# Patient Record
Sex: Male | Born: 2000 | Race: White | Hispanic: No | Marital: Single | Smoking: Never smoker
Health system: Southern US, Community
[De-identification: ages and names within clinical notes are randomized; demographics above are authoritative.]

## PROBLEM LIST (undated history)

## (undated) HISTORY — PX: NO PAST SURGERIES: SHX2092

---
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
## 2019-01-26 ENCOUNTER — Other Ambulatory Visit: Payer: Self-pay | Admitting: Physician Assistant

## 2019-01-26 DIAGNOSIS — R599 Enlarged lymph nodes, unspecified: Secondary | ICD-10-CM

## 2019-02-04 ENCOUNTER — Other Ambulatory Visit: Payer: Self-pay

## 2019-02-04 ENCOUNTER — Ambulatory Visit
Admission: RE | Admit: 2019-02-04 | Discharge: 2019-02-04 | Disposition: A | Discharge status: Home / Self Care | Payer: BC Managed Care – PPO | Source: Ambulatory Visit | Attending: Physician Assistant | Admitting: Physician Assistant

## 2019-02-04 DIAGNOSIS — R599 Enlarged lymph nodes, unspecified: Secondary | ICD-10-CM | POA: Insufficient documentation

## 2019-06-22 ENCOUNTER — Other Ambulatory Visit: Payer: Self-pay

## 2019-06-22 DIAGNOSIS — Z20822 Contact with and (suspected) exposure to covid-19: Secondary | ICD-10-CM

## 2019-06-23 ENCOUNTER — Telehealth: Payer: Self-pay | Admitting: General Practice

## 2019-06-23 LAB — NOVEL CORONAVIRUS, NAA: SARS-CoV-2, NAA: NOT DETECTED

## 2019-06-23 NOTE — Telephone Encounter (Signed)
Negative COVID results given. Patient results "NOT Detected." Caller expressed understanding. ° °

## 2020-04-11 ENCOUNTER — Telehealth (INDEPENDENT_AMBULATORY_CARE_PROVIDER_SITE_OTHER): Payer: BC Managed Care – PPO | Admitting: Psychiatry

## 2020-04-11 ENCOUNTER — Other Ambulatory Visit: Payer: Self-pay

## 2020-04-11 DIAGNOSIS — F988 Other specified behavioral and emotional disorders with onset usually occurring in childhood and adolescence: Secondary | ICD-10-CM

## 2020-04-11 NOTE — Progress Notes (Signed)
Patient is currently not physically present in [place].  Hence discussed with him that this evaluation cannot be completed.  He will call to reschedule as needed.

## 2020-12-11 IMAGING — US SOFT TISSUE ULTRASOUND HEAD/NECK
1 series · 14 of 25 positions shown · non-contrast
Comparison: None.

CLINICAL DATA: Enlarged lymph node

EXAM:
ULTRASOUND OF HEAD/NECK SOFT TISSUES
TECHNIQUE: Ultrasound examination of the head and neck soft tissues was
performed in the area of clinical concern.

[Series 1: soft tissue ultrasound head/neck · 0.07mm/px · 14 of 29 slices shown]
[im 1/29]
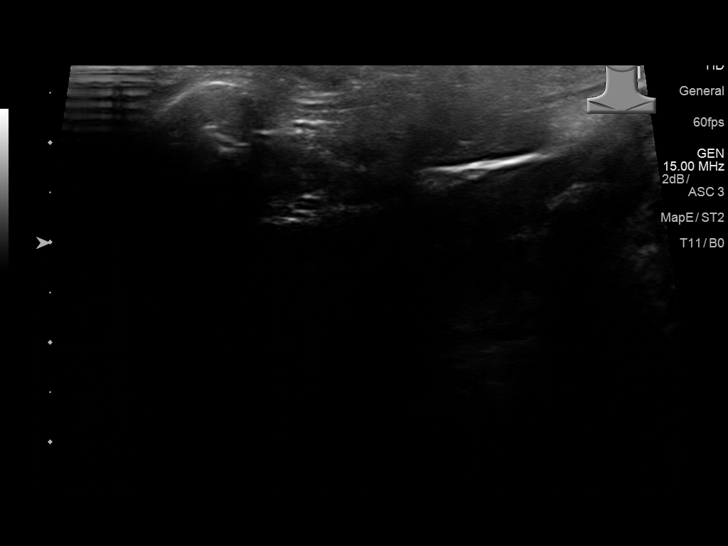
[im 3/29]
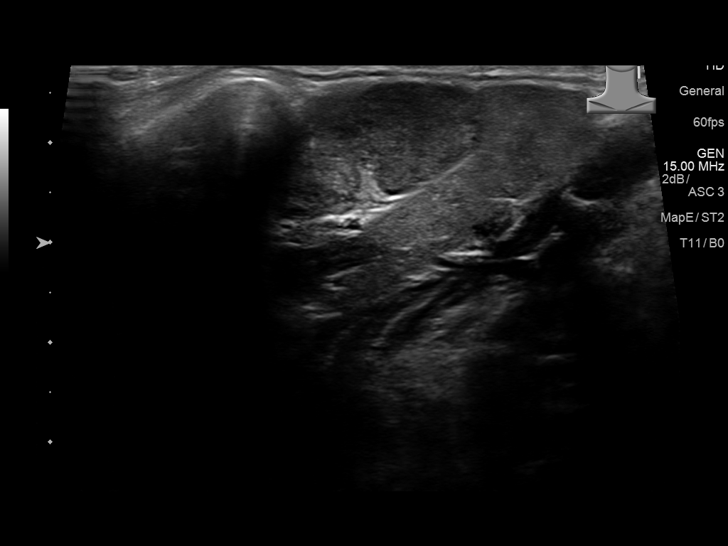
[im 5/29]
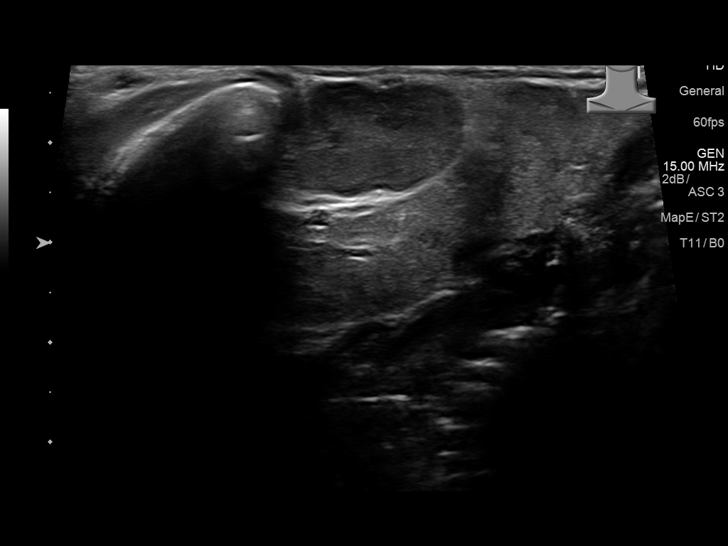
[im 8/29]
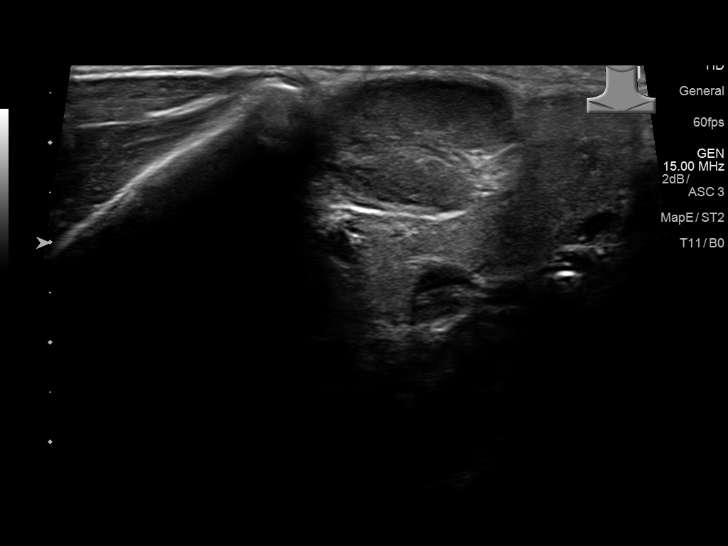
[im 10/29]
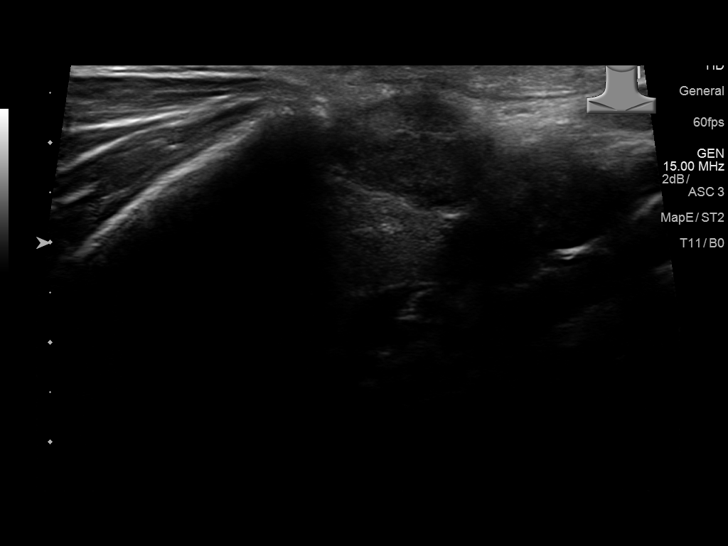
[im 11/29]
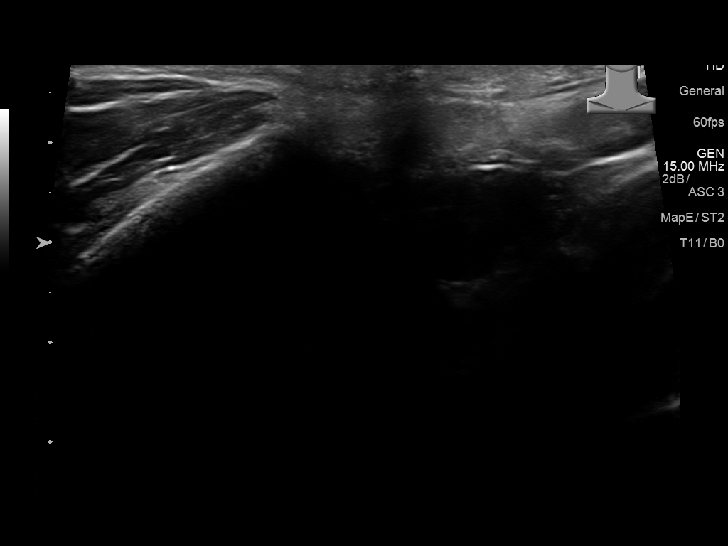
[im 13/29]
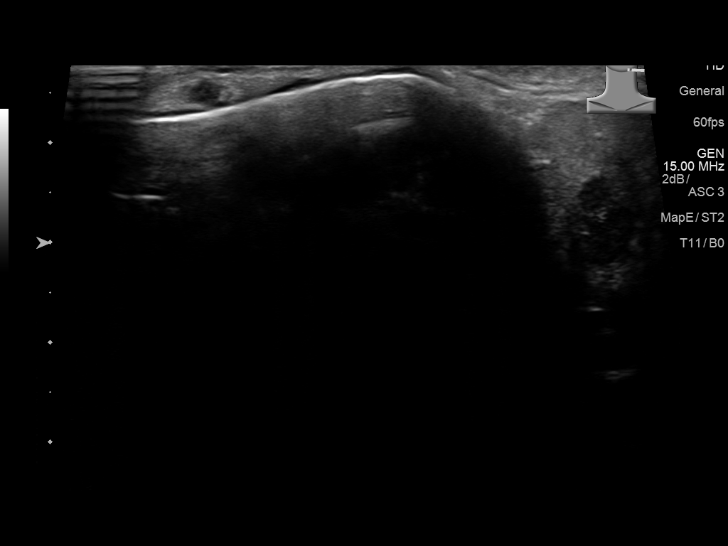
[im 16/29]
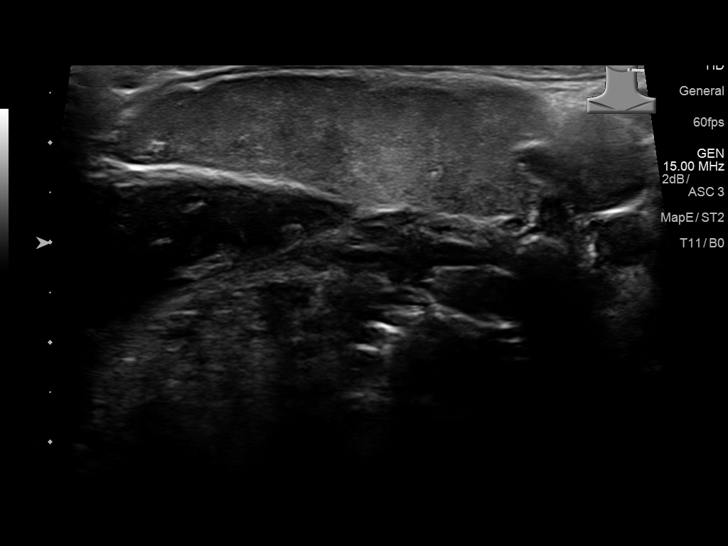
[im 18/29]
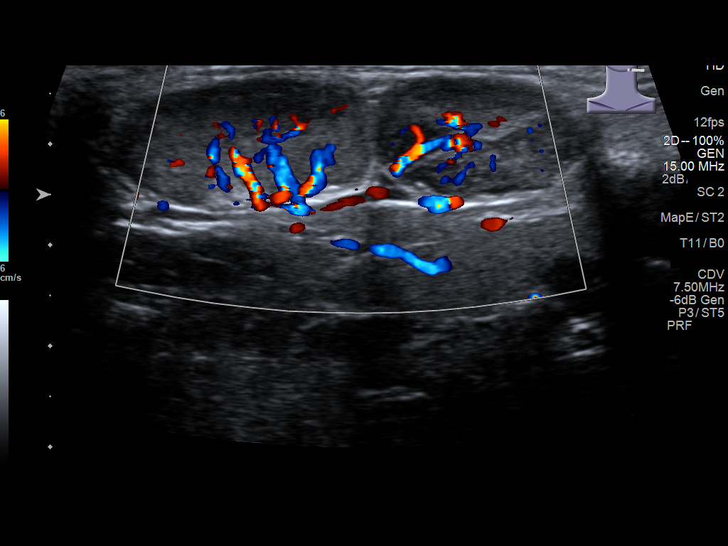
[im 19/29]
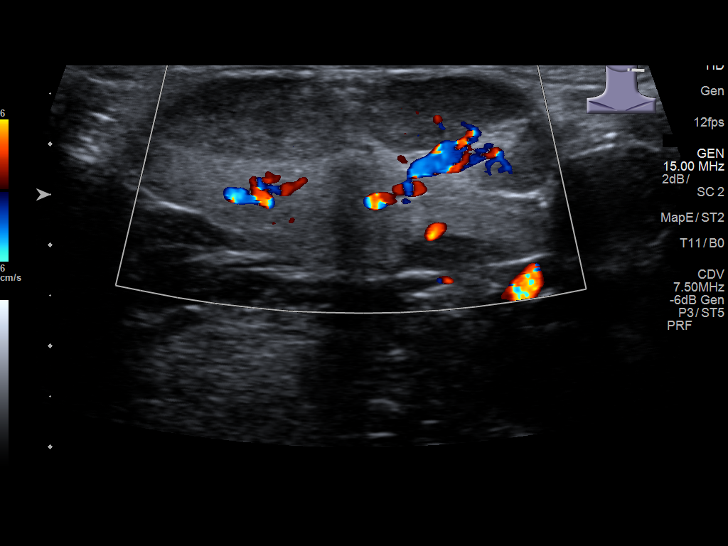
[im 22/29]
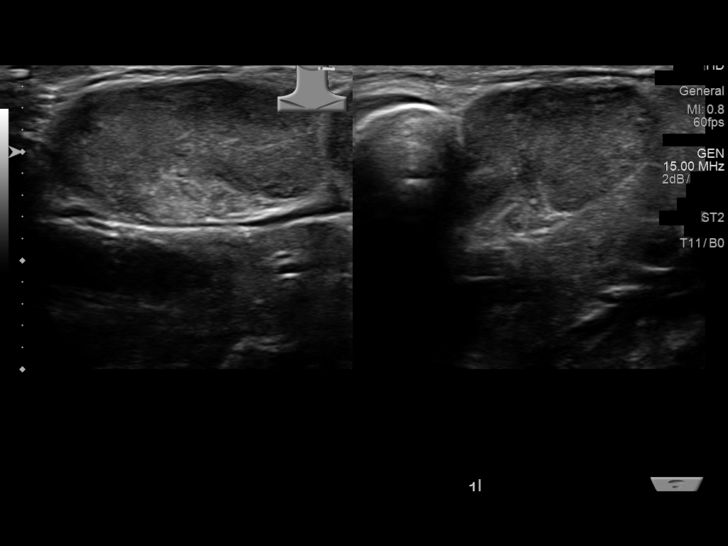
[im 24/29]
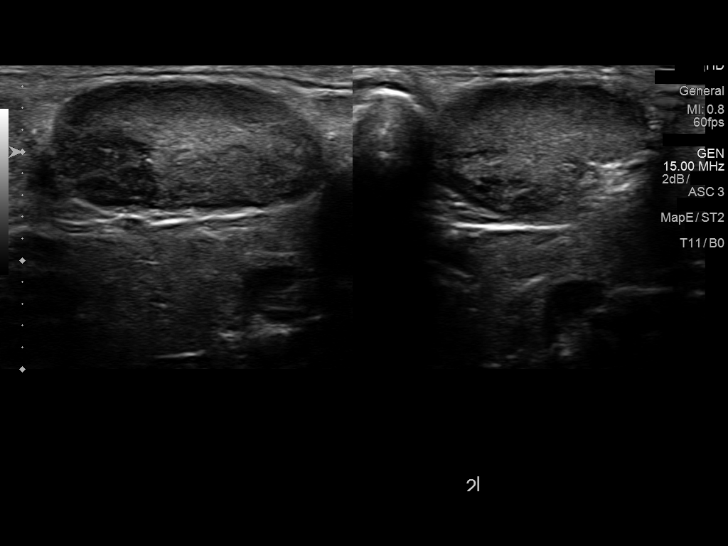
[im 26/29]
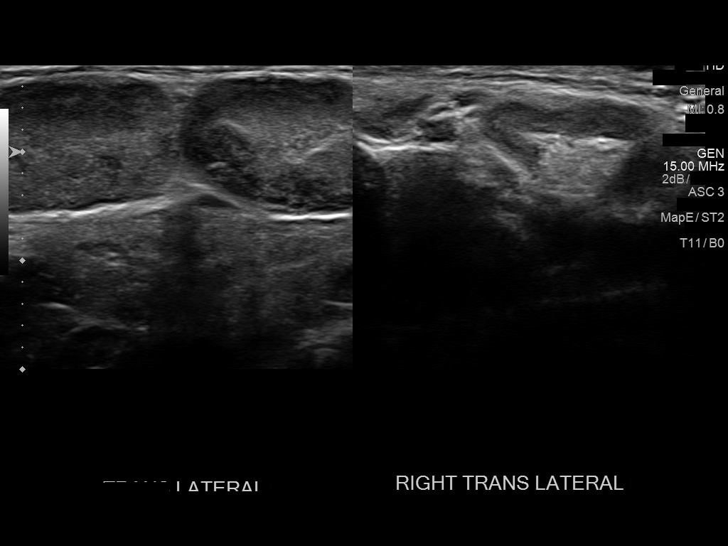
[im 29/29]
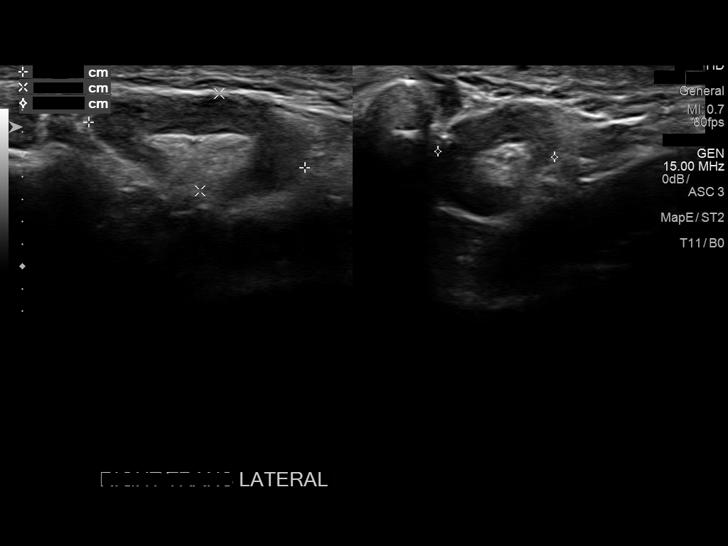

[14 of 25 positions shown; findings below may reference images not displayed]

FINDINGS: The left upper neck, there are 2 oval-shaped predominantly
hypoechoic but heterogeneous structures measuring 2.6 x 1.3 x 1.8 cm
and 2.5 x 1.2 x 2.0 cm. There is increased blood flow demonstrated
with Doppler. No fatty hilum is evident. On the right, there is a
normal appearing lymph node.
IMPRESSION: Enlarged left upper cervical lymph nodes with suspected
inflammation, possibly indicating lymphadenitis.
